# Patient Record
Sex: Female | Born: 1935 | Race: Black or African American | Hispanic: No
Health system: Southern US, Community
[De-identification: ages and names within clinical notes are randomized; demographics above are authoritative.]

---

## 2004-11-23 ENCOUNTER — Other Ambulatory Visit: Payer: Self-pay

## 2004-11-23 ENCOUNTER — Emergency Department: Payer: Self-pay | Admitting: Unknown Physician Specialty

## 2009-09-27 ENCOUNTER — Inpatient Hospital Stay: Payer: Self-pay | Admitting: Internal Medicine

## 2011-08-20 ENCOUNTER — Emergency Department: Payer: Self-pay | Admitting: Emergency Medicine

## 2011-09-20 ENCOUNTER — Emergency Department: Payer: Self-pay | Admitting: Unknown Physician Specialty

## 2011-11-08 ENCOUNTER — Ambulatory Visit: Payer: Self-pay | Admitting: Internal Medicine

## 2011-11-13 ENCOUNTER — Inpatient Hospital Stay: Payer: Self-pay | Admitting: Internal Medicine

## 2011-11-13 LAB — URINALYSIS, COMPLETE
Bilirubin,UR: NEGATIVE
Blood: NEGATIVE
Nitrite: NEGATIVE
Ph: 5 (ref 4.5–8.0)
RBC,UR: 159 /HPF (ref 0–5)
Squamous Epithelial: 8

## 2011-11-13 LAB — CK TOTAL AND CKMB (NOT AT ARMC)
CK, Total: 41 U/L (ref 21–215)
CK-MB: 0.6 ng/mL (ref 0.5–3.6)
CK-MB: 0.9 ng/mL (ref 0.5–3.6)

## 2011-11-13 LAB — COMPREHENSIVE METABOLIC PANEL
Anion Gap: 10 (ref 7–16)
Bilirubin,Total: 0.4 mg/dL (ref 0.2–1.0)
Calcium, Total: 9.7 mg/dL (ref 8.5–10.1)
Chloride: 102 mmol/L (ref 98–107)
Co2: 28 mmol/L (ref 21–32)
Creatinine: 0.88 mg/dL (ref 0.60–1.30)
EGFR (Non-African Amer.): >60
Glucose: 174 mg/dL — ABNORMAL HIGH (ref 65–99)
Osmolality: 286 (ref 275–301)
Potassium: 3.6 mmol/L (ref 3.5–5.1)
Sodium: 140 mmol/L (ref 136–145)

## 2011-11-13 LAB — TROPONIN I: Troponin-I: 0.03 ng/mL

## 2011-11-13 LAB — CBC
MCH: 29.9 pg (ref 26.0–34.0)
MCHC: 32.8 g/dL (ref 32.0–36.0)
MCV: 91 fL (ref 80–100)
Platelet: 261 10*3/uL (ref 150–440)
RBC: 3.77 10*6/uL — ABNORMAL LOW (ref 3.80–5.20)
RDW: 16.5 % — ABNORMAL HIGH (ref 11.5–14.5)

## 2011-11-13 LAB — MAGNESIUM: Magnesium: 1.7 mg/dL — ABNORMAL LOW

## 2011-11-13 LAB — PHOSPHORUS: Phosphorus: 2.3 mg/dL — ABNORMAL LOW (ref 2.5–4.9)

## 2011-11-14 LAB — CBC WITH DIFFERENTIAL/PLATELET
Basophil #: 0 10*3/uL (ref 0.0–0.1)
Eosinophil #: 0 10*3/uL (ref 0.0–0.7)
MCH: 29.9 pg (ref 26.0–34.0)
MCHC: 33.2 g/dL (ref 32.0–36.0)
MCV: 90 fL (ref 80–100)
Monocyte #: 0.6 10*3/uL (ref 0.0–0.7)
Neutrophil %: 46.5 %
Platelet: 246 10*3/uL (ref 150–440)
RBC: 3.6 10*6/uL — ABNORMAL LOW (ref 3.80–5.20)
RDW: 16.1 % — ABNORMAL HIGH (ref 11.5–14.5)

## 2011-11-14 LAB — CK TOTAL AND CKMB (NOT AT ARMC)
CK, Total: 47 U/L (ref 21–215)
CK-MB: 1.1 ng/mL (ref 0.5–3.6)

## 2011-11-14 LAB — TSH: Thyroid Stimulating Horm: 2.9 u[IU]/mL

## 2011-11-14 LAB — IRON AND TIBC
Iron Bind.Cap.(Total): 249 ug/dL — ABNORMAL LOW (ref 250–450)
Iron Saturation: 8 %
Unbound Iron-Bind.Cap.: 229 ug/dL

## 2011-11-14 LAB — BASIC METABOLIC PANEL
Anion Gap: 11 (ref 7–16)
Chloride: 104 mmol/L (ref 98–107)
Co2: 26 mmol/L (ref 21–32)
Creatinine: 0.6 mg/dL (ref 0.60–1.30)
EGFR (African American): >60
Glucose: 106 mg/dL — ABNORMAL HIGH (ref 65–99)
Sodium: 141 mmol/L (ref 136–145)

## 2011-11-14 LAB — MAGNESIUM: Magnesium: 2.4 mg/dL

## 2011-11-14 LAB — TROPONIN I: Troponin-I: 0.03 ng/mL

## 2011-11-14 LAB — PROTIME-INR: Prothrombin Time: 13.2 secs (ref 11.5–14.7)

## 2011-11-15 LAB — HEMOGLOBIN: HGB: 9.9 g/dL — ABNORMAL LOW (ref 12.0–16.0)

## 2011-11-15 LAB — BASIC METABOLIC PANEL
Calcium, Total: 8.9 mg/dL (ref 8.5–10.1)
Chloride: 107 mmol/L (ref 98–107)
Co2: 27 mmol/L (ref 21–32)
Creatinine: 0.68 mg/dL (ref 0.60–1.30)
EGFR (African American): >60
Potassium: 3.8 mmol/L (ref 3.5–5.1)
Sodium: 142 mmol/L (ref 136–145)

## 2011-11-16 LAB — URINE CULTURE

## 2011-11-18 ENCOUNTER — Inpatient Hospital Stay: Payer: Self-pay | Admitting: Internal Medicine

## 2011-11-18 LAB — CBC
HCT: 33.9 % — ABNORMAL LOW (ref 35.0–47.0)
HGB: 11.2 g/dL — ABNORMAL LOW (ref 12.0–16.0)
MCH: 30.1 pg (ref 26.0–34.0)
MCHC: 33 g/dL (ref 32.0–36.0)
Platelet: 275 10*3/uL (ref 150–440)

## 2011-11-18 LAB — COMPREHENSIVE METABOLIC PANEL
Alkaline Phosphatase: 91 U/L (ref 50–136)
Bilirubin,Total: 0.2 mg/dL (ref 0.2–1.0)
Calcium, Total: 10.1 mg/dL (ref 8.5–10.1)
Chloride: 102 mmol/L (ref 98–107)
Co2: 28 mmol/L (ref 21–32)
Creatinine: 1.04 mg/dL (ref 0.60–1.30)
EGFR (African American): >60
EGFR (Non-African Amer.): 52 — ABNORMAL LOW
Glucose: 96 mg/dL (ref 65–99)
SGPT (ALT): 18 U/L

## 2011-12-06 ENCOUNTER — Ambulatory Visit: Payer: Self-pay | Admitting: Internal Medicine

## 2012-03-23 ENCOUNTER — Ambulatory Visit: Payer: Self-pay | Admitting: Internal Medicine

## 2012-03-31 IMAGING — CR DG CHEST 1V PORT
1 series · 1 of 1 positions shown · non-contrast
Comparison: none

REASON FOR EXAM: weakness
COMMENTS:

[x chest ap]
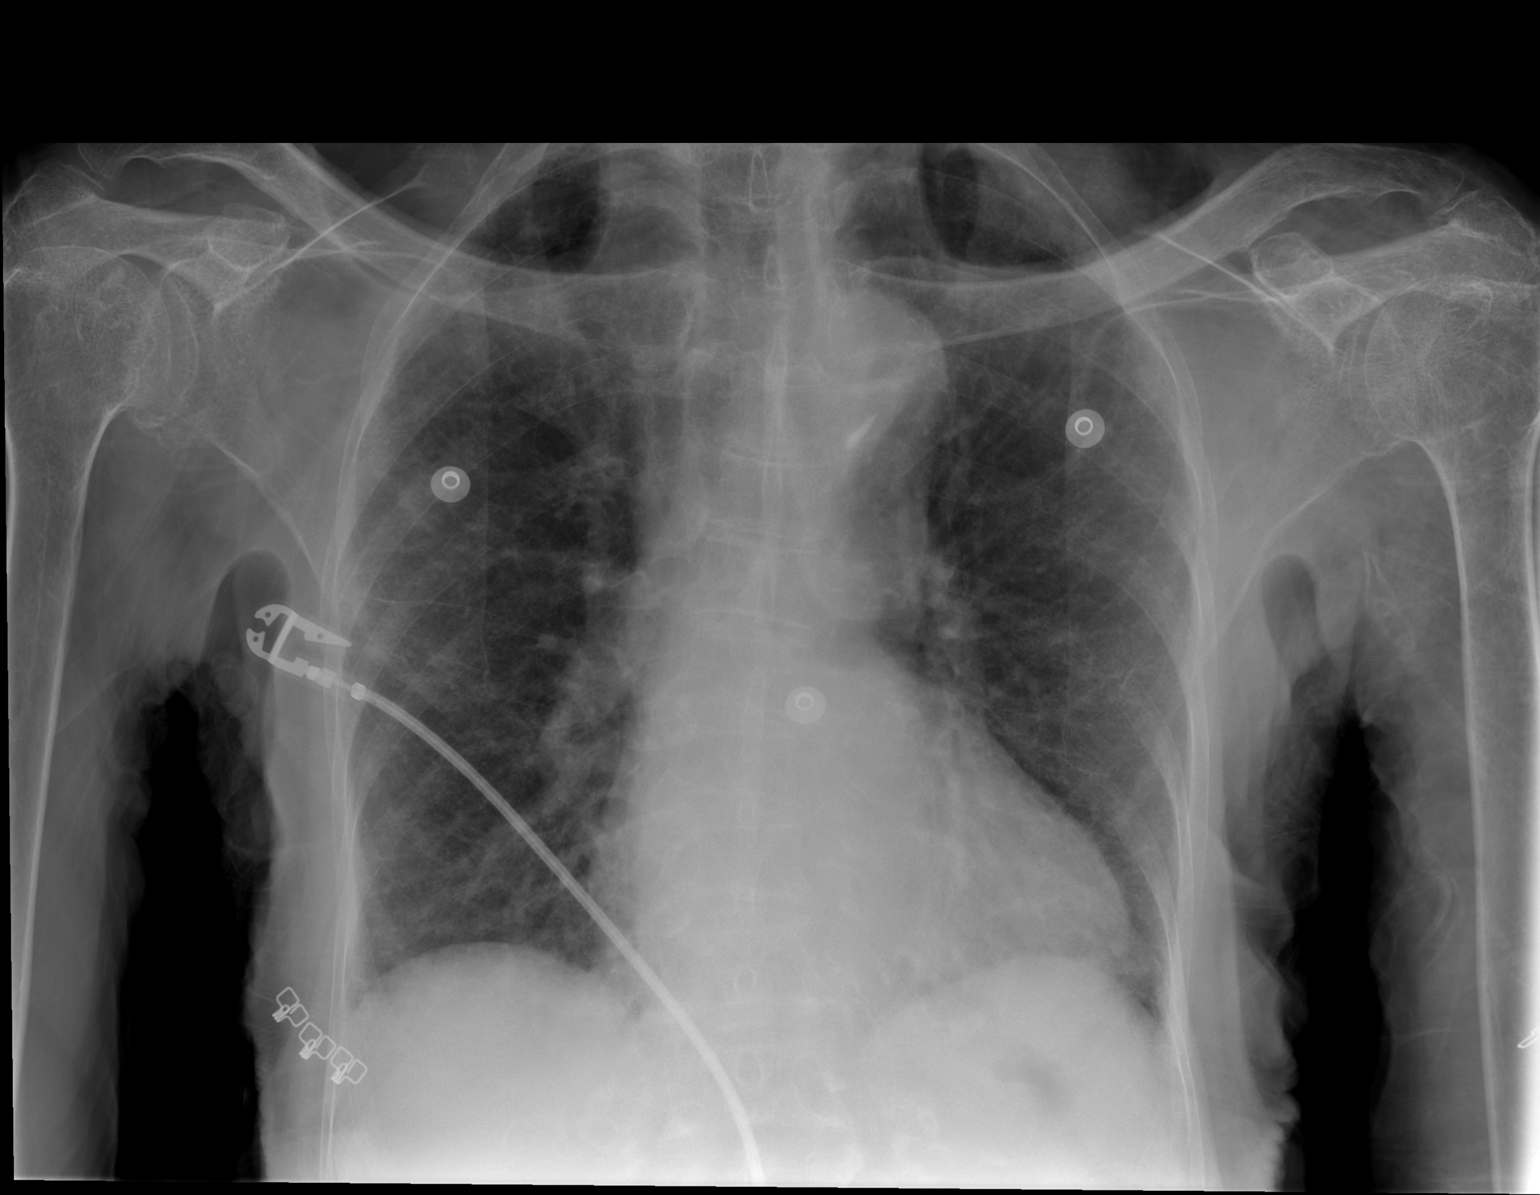

[1 of 1 positions shown; findings below may reference images not displayed]

PROCEDURE:     DXR - DXR PORTABLE CHEST SINGLE VIEW  - September 20, 2011 [DATE]

RESULT:

AP view of the chest is compared to the exam of 20 August, 2011.

There appears to be some hyperlucency in the supraclavicular region of the
soft tissues at the base of the neck which could be secondary to fatty
tissue. Subcutaneous emphysema is not definitely identified. There also
appears to be some artifact from the patient gown. The lungs appear to be
fully inflated and clear. If there is concern for occult pneumomediastinum
or pneumothorax then CT would be suggested. The heart size appears within
normal limits. Atherosclerotic calcification is present in the aortic arch.
Degenerative changes are seen in the shoulders. The bones are osteopenic.
IMPRESSION: Borderline cardiomegaly.

## 2012-03-31 IMAGING — CT CT HEAD WITHOUT CONTRAST
2 series · 16 of 30 positions shown, 20 images · non-contrast
Comparison: none

REASON FOR EXAM: ams weakness
COMMENTS:

PROCEDURE:     CT  - CT HEAD WITHOUT CONTRAST  - September 20, 2011 [DATE]
RESULT:     History: Weakness.
Comparison Study: Prior CT of 08/20/2011.

[Series 2: without · axial · non-contrast · 0.39mm/px · z∈[-136,-16]mm · 13 of 29 slices shown, 17 images]
[im 3/29  brain]
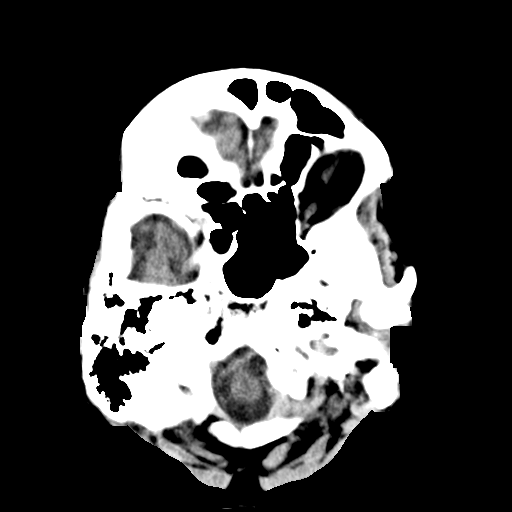
[im 3/29  bone]
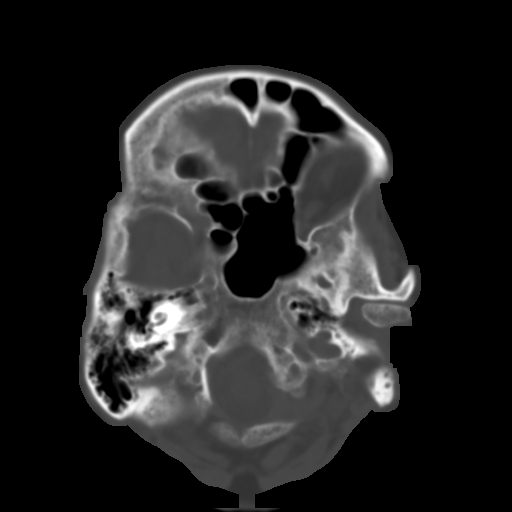
[im 5/29  brain]
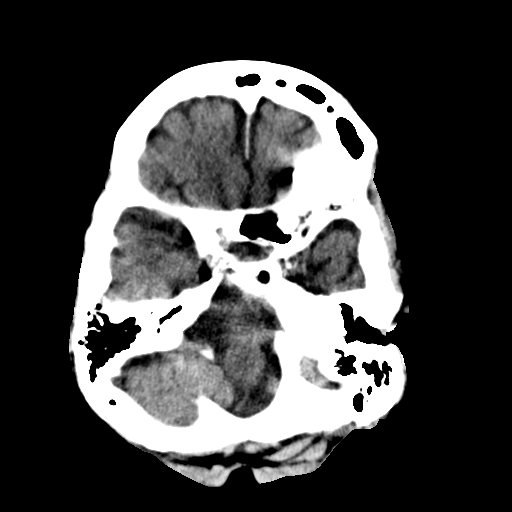
[im 7/29  brain]
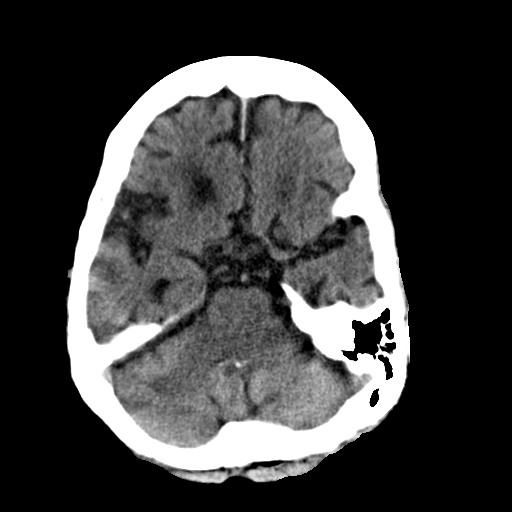
[im 9/29  brain]
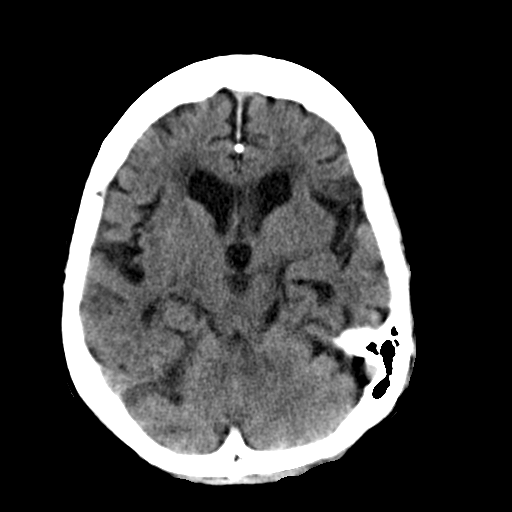
[im 11/29  brain]
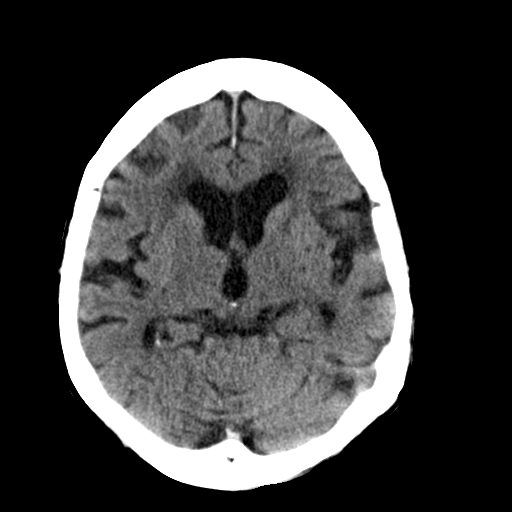
[im 11/29  bone]
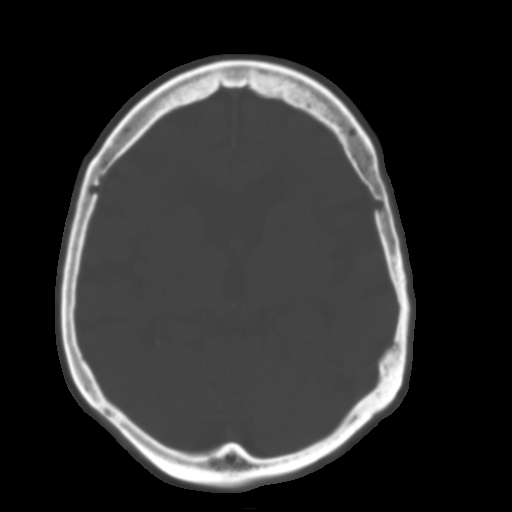
[im 13/29  brain]
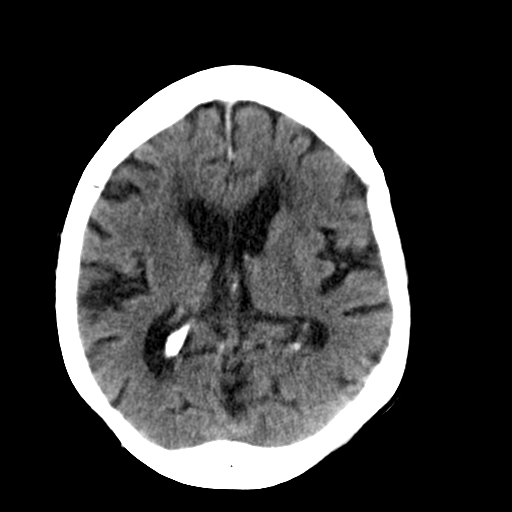
[im 15/29  brain]
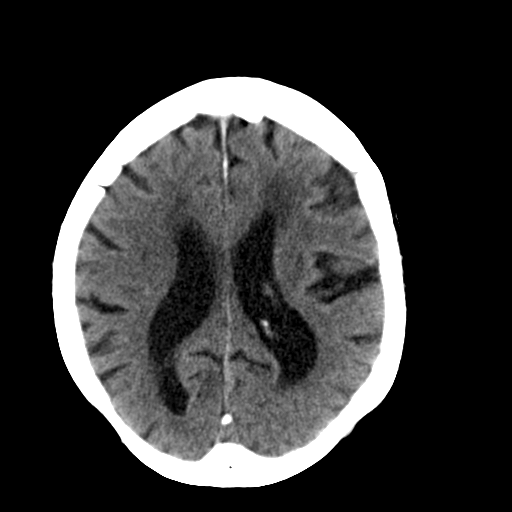
[im 17/29  brain]
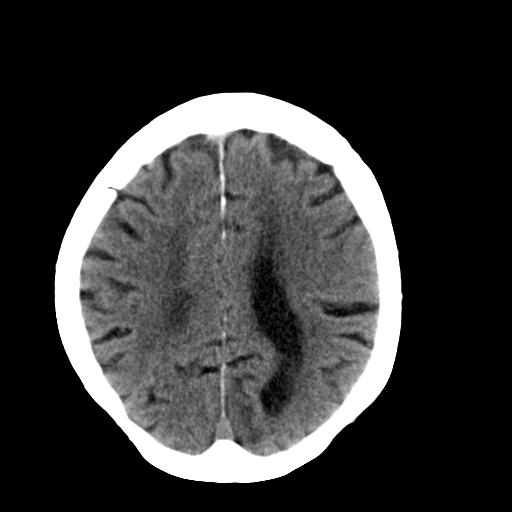
[im 19/29  brain]
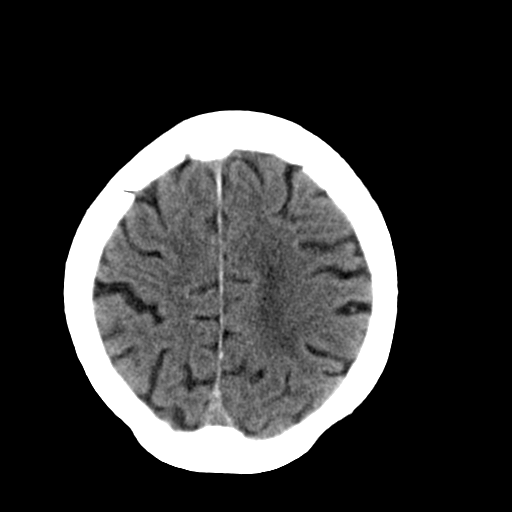
[im 19/29  bone]
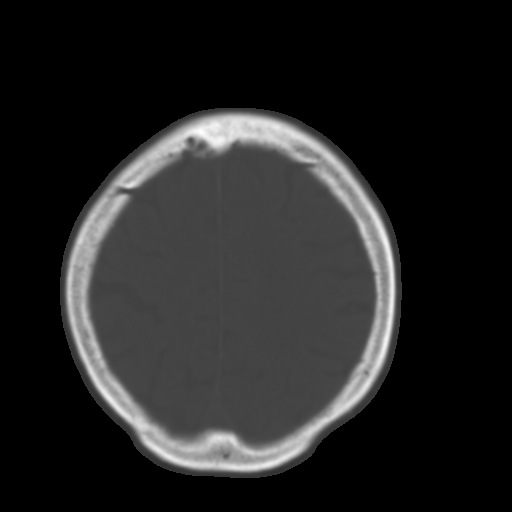
[im 21/29  brain]
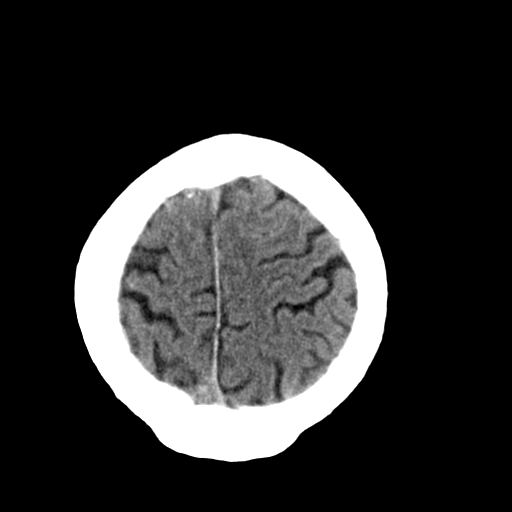
[im 23/29  brain]
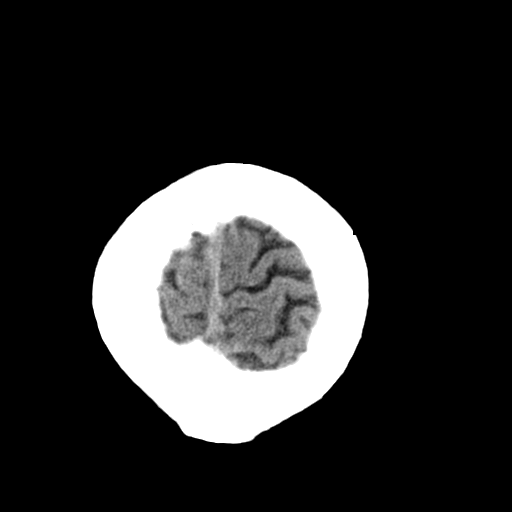
[im 25/29  brain]
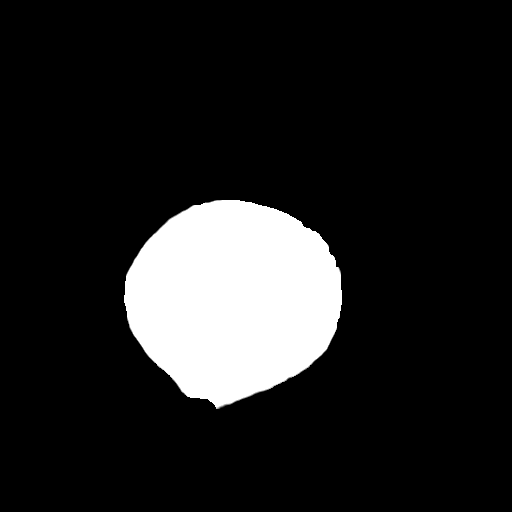
[im 27/29  brain]
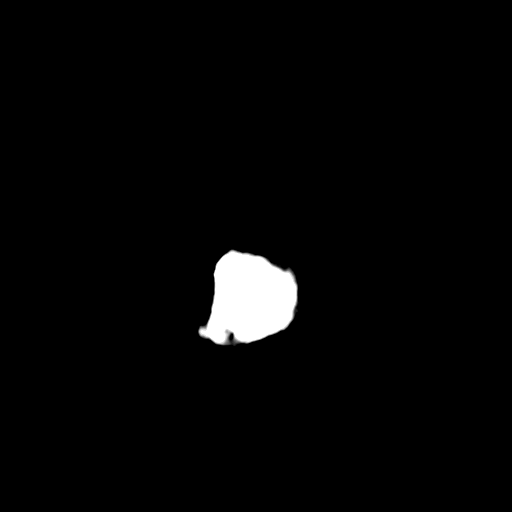
[im 27/29  bone]
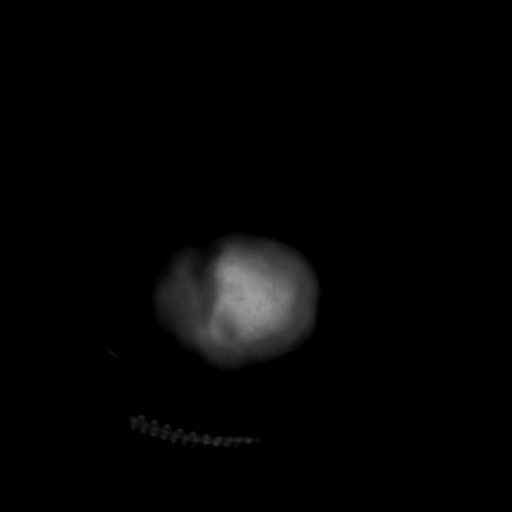

[Series 3: bone · axial · 0.39mm/px · z∈[-136,-96]mm · 3 of 29 slices shown]
[im 3/29  bone]
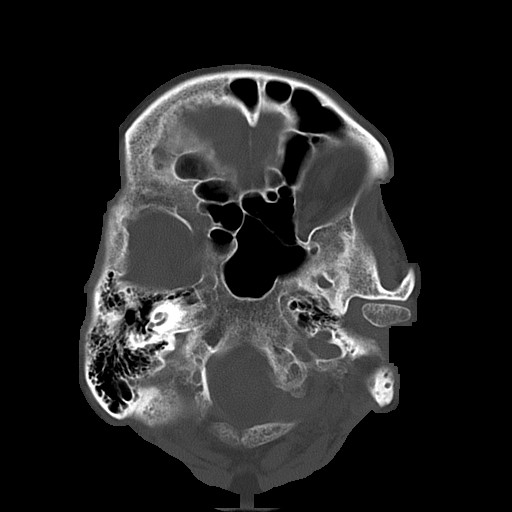
[im 7/29  bone]
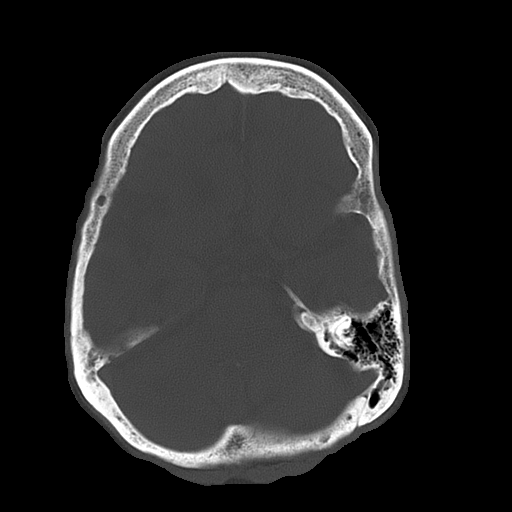
[im 11/29  bone]
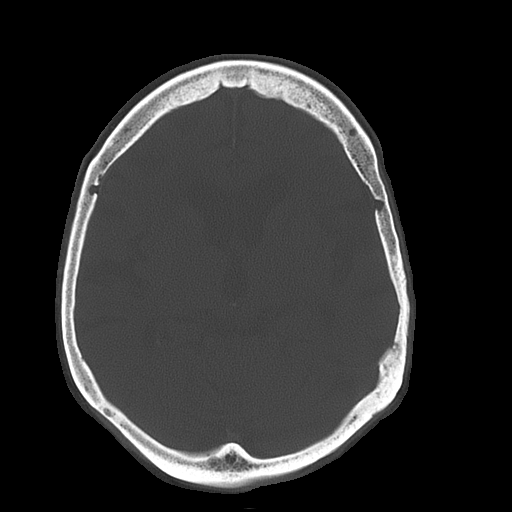

[16 of 30 positions shown; findings below may reference images not displayed]

FINDINGS: No mass. No hydrocephalus. No hemorrhage. White matter changes
consistent with chronic ischemia. No acute bony abnormality. Mucous
retention cyst noted in the sphenoid sinus. Lucency in the right thalamus.
This is consistent with lacunar infarction. This is stable.
IMPRESSION: No acute abnormality.

## 2012-12-05 DEATH — deceased

## 2015-01-29 NOTE — Consult Note (Signed)
    General Aspect 81 yo female  with history of hypertension whh presented to the the er with weakness and fatigue. She is a difficult historian. She was noted to have ventricular tachycardia in the er. She is currently on amiodarone iv and is currently in nsr. She has ruled out for an mi.   Physical Exam:   GEN thin    NECK supple    RESP clear BS    CARD Regular rate and rhythm    ABD denies tenderness  normal BS    LYMPH negative neck    EXTR negative cyanosis/clubbing, negative edema    SKIN normal to palpation    NEURO cranial nerves intact, motor/sensory function intact    PSYCH poor insight   Review of Systems:   Subjective/Chief Complaint poor historian.    General: Fatigue    Skin: No Complaints    ENT: No Complaints    Eyes: No Complaints    Neck: No Complaints    Respiratory: Short of breath    Cardiovascular: Tightness    Gastrointestinal: No Complaints    Genitourinary: No Complaints    Vascular: No Complaints    Musculoskeletal: No Complaints    Neurologic: No Complaints    Hematologic: No Complaints    Endocrine: No Complaints    Psychiatric: No Complaints    ROS limited historian    Medications/Allergies Reviewed Medications/Allergies reviewed     Anxiety:    HTN:   EKG:   EKG NSR    No Known Allergies:     Impression 81 yo female with history of hypertension and dementia who had non sustaned ventricular tachycardia. Currently in nsr on iv amiodarone. Would continue with amidoarone load. Echo to evaluate lv funciton.    Plan 1. IV amiodaron load and then po  2. Echo to evaluate lv funciotkion 3. Follow k and mg   Electronic Signatures: Dalia HeadingFath, Odeth Bry A (MD)  (Signed 06-Feb-13 20:55)  Authored: General Aspect/Present Illness, History and Physical Exam, Review of System, Past Medical History, EKG , Allergies, Impression/Plan   Last Updated: 06-Feb-13 20:55 by Dalia HeadingFath, Tip Atienza A (MD)

## 2015-01-29 NOTE — Consult Note (Signed)
PATIENT NAME:  Beverly Morris, Beverly Morris MR#:  [id number] DATE OF BIRTH:  10/12/1910  DATE OF CONSULTATION:  11/15/2011  REFERRING PHYSICIAN:  Conchita Paris, MD  CONSULTING PHYSICIAN:  Rose Phi. Kemper Durie, MD  HISTORY: Ms. Delgadillo is a 81 year old right-handed African American patient of Dr. Conchita Paris with history of hypertension, anemia, and anxiety who was admitted 11/13/2011 and is referred for evaluation of suspected seizure spells. History comes from the patient, her family members, her hospital chart, and her hospital records.   The patient was brought to the Emergency Room at noon on 11/13/2011 by EMTs summoned by family secondary to altered mental status. She had been not her usual self on awakening in the morning, quiet and less interactive. At approximately 10:30 a.m. after she had been up and about and had breakfast and had bath with help of caregiver, the patient was sitting at the kitchen table when she was seen by her caregiver to have a spell of shaking of head, torso, and arms lasting approximately 60 seconds followed by blank stare. She had a second shaking spell with fire department first responder, later a third spell witnessed by the ER physician. Per family member's report that between the shaking spells she had persistent staring without response to address.   EMTs found on monitoring the patient had cycles of ventricular tachycardia lasting 15 to 20 seconds alternating with sinus tachycardia for 2 or 3 minutes at a time. Brain CT scan showed no acute findings. She was started on treatment with Keppra for suspected seizures. She has had no further spells in the hospital. On telemetry in the hospital, she has had no ventricular tachycardia. She has been in sinus rhythm with occasional mild sinus tachycardia.   In the past, the patient has had no similar spells of shaking. Her family reports history of panic attacks with hyperventilation, benefiting from breathing into a bag in past years. They  report that she has not had anxiety or panic attack spells in the past few years. They report greater than 5 and less than 10 year history of spells of blank stare lasting for more than several minutes, initially occurring perhaps every couple of months, and increased in frequency in the past year or so. She had Emergency Room visits for spells of gaze and not speaking 08/20/2011 and 09/20/2011. She had Emergency Room visit and brain CT scan 10/01/2009 after she had a fall with history that she had the onset two or so days earlier of right leg weakness. Her scan showed evidence of right cerebellar hemisphere, small to moderate area of nonhemorrhagic stroke found to not be present on prior scans.   PHYSICAL EXAMINATION: The patient is a well developed thin elderly African American woman who was examined lying semisupine in no apparent distress with blood pressure 145/75 and heart rate 80. There was no fever. She was normocephalic without evidence of trauma and her neck was supple. She was alert and was oriented to persons and location and to time of day and month but not to date. Her speech was clear and she had normal expression and affect. She was very hard of hearing but communicated fairly well when addressed slowly at increased volume. Cranial nerve examination was otherwise normal including eye movements and full visual fields to finger count for each eye. Motor examination showed good power in the arms and legs bilaterally. Coordination examination was notable for mild dystaxia of right finger-to-nose movements. Her gait was not tested.   IMPRESSION: Spells most  consistent with new seizure disorder in an elderly individual.   RECOMMENDATIONS:  1. Treat with Keppra 500 mg twice a day after initial period of 1000 mg twice a day for three days.  2. I do not see any indication for lumbar puncture.   I appreciate being asked to see this pleasant and interesting lady.    ____________________________ Rose PhiPeter R. Kemper Durielarke, MD prc:drc D: 11/16/2011 09:34:50 ET T: 11/16/2011 10:08:03 ET JOB#: [id number]  cc: Rose PhiPeter R. Kemper Durielarke, MD, <Dictator> Gaspar GarbePETER R Quida Glasser MD ELECTRONICALLY SIGNED 11/18/2011 13:33

## 2015-01-29 NOTE — H&P (Signed)
PATIENT NAME:  Beverly Morris, Beverly Morris MR#:  [id number] DATE OF BIRTH:  October 26, 1910  DATE OF ADMISSION:  11/13/2011  PRIMARY CARE PHYSICIAN: Roe Coombs C. Candelaria Stagers, MD    REQUESTING PHYSICIAN:  Dana Allan, MD   CHIEF COMPLAINT: Seizure.  HISTORY OF PRESENT ILLNESS: The patient is a 81 year old female with a known history of anemia, hypertension, is being admitted for seizure. The patient lives at home where she was found to have possible seizure activity this morning. She is followed by a home nurse who came this morning and saw her to have twitching mainly in the upper body part and torso. EMS was called. At that time she was lucid, was not waking up. She was brought into the Emergency Department. While en route, a 12-lead EKG was performed which showed ventricular tachycardia lasting approximately 20 to 30 seconds which converted back into sinus tachycardia/sinus rhythm. While in the Emergency Department, she had another spell of twitching/seizure when the ED physician was evaluating her; and that also stopped on its own, but she was found to have ventricular tachycardia on her rhythm strip. She was asymptomatic, was awake, and was started on amiodarone drip. Subsequently, she has been comfortable. When I evaluated the patient her daughter was at the bedside, who provided most of the information as the patient is hard of hearing and somewhat confused.   PAST MEDICAL HISTORY:  1. Hypertension. 2. Anxiety.  PAST SURGICAL HISTORY:  1. ERCP with biliary sphincterotomy and balloon extraction of CBD stone in 2003.  2. Laparoscopic cholecystectomy. 3. Laparoscopic hysterectomy.   MEDICATIONS AT HOME:  1. Aspirin 81 mg p.o. daily.  2. Multivitamin once daily.  3. Calcium tablet once daily.  FAMILY HISTORY: No history of cerebrovascular accident or seizure.   SOCIAL HISTORY: No tobacco or alcohol use. She lives at home. She has a Patent examiner following.   REVIEW OF SYSTEMS: Unable to obtain as the patient  is somewhat confused, probably has underlying dementia.   PHYSICAL EXAMINATION:  VITAL SIGNS: Temperature 97.6, heart rate 102 per minute, respirations 24 per minute, blood pressure 120/71 mmHg. She is saturating 97% on room air.   GENERAL: The patient is a 81 year old female lying in the bed comfortably without any acute distress.   HEENT: Eyes: Pupils are equal, round and reactive to light and accommodation. No scleral icterus. Extraocular muscles are intact. HENT: Head atraumatic, normocephalic. Oropharynx and nasopharynx are clear.   NECK: Supple. No jugular venous distention. No thyroid enlargement or tenderness.   LUNGS: Clear to auscultation bilaterally. No wheezing, rales, rhonchi, or crepitation.   HEART: S1, S2 normal. No murmurs, rubs, or gallops.   ABDOMEN: Soft, nontender, nondistended. Bowel sounds are present. No organomegaly or mass.   EXTREMITIES: No pedal edema, cyanosis or clubbing.   SKIN: No obvious rash, lesion or ulcer. She has depigmentation all over her lower extremities.    NEUROLOGIC: she does have more weakness in the left side likely from her old cerebrovascular accident. Strength about 4 out of 5 in left upper extremity and lower extremity, otherwise 5 out of 5 strength all other extremities. She is moving all her extremities without any difficulties. Cranial nerves III through XII are intact. Sensation is intact.   PSYCHIATRIC: The patient is disoriented although could identify her daughter. She does not know where she is. She is alert.   LABORATORY, DIAGNOSTIC AND RADIOLOGICAL DATA:  Normal BMP, magnesium 1.7, phosphorus of 2.3.  Normal renal function tests.  Normal first set of cardiac  enzymes.  Normal CBC except hemoglobin of 11.3, hematocrit 34.4.  Urinalysis shows 4036 WBCs, 1+ bacteria, 2+ leukocyte esterase, WBC in clumps.  EKG shows sinus tachycardia with first-degree AV block, no major ST-T wave changes. Her rhythm strip does show ventricular  tachycardia.   IMPRESSION AND PLAN:  1. Seizure: We will start her on Keppra 500 mg b.i.d. as per discussion with Dr. Suzan SlickPeter Clarke on the phone. He recommended to increase the Keppra to 1000 mg b.i.d.  for the next two days and bring it down to 500 mg b.i.d. after two days. This could be due to underlying seizure focus (old cerebrovascular accident.) We will keep her in the Critical Care Unit for seizure and fall precautions. Hold off Neurology consult as per Dr. Suzan SlickPeter Clarke; if needed, call his office. Ventricular tachycardia: We will start her on amiodarone drip. Cardiology consult will be obtained. We will obtain 2-D echocardiogram, replace her electrolytes aggressively, including magnesium and phosphorus. We will continue aspirin.  2. Hypertension: Blood pressure is stable. We will monitor while in the Critical Care Unit.  3. History of anxiety: Not on medication. Her mood remains stable.  4.   UTI: Based on UA, will obtain urine c/s, start rocephin.  CODE STATUS: FULL CODE. I discussed with her daughter, who agrees with the same. She states she does not want a long-term artificial machine or a ventilator. We will consider Palliative Care consult if things change while in the hospital.   TOTAL TIME TAKING CARE OF THIS PATIENT (CRITICAL CARE): 55 minutes.   The patient remains at high risk of cardiorespiratory arrest and recurrent seizure. We will monitor in the Critical Care Unit.  ____________________________ Ellamae SiaVipul S. Sherryll BurgerShah, MD vss:cbb D: 11/13/2011 16:08:32 ET T: 11/13/2011 16:36:03 ET JOB#: [id number] cc: Don C. Candelaria Stagershaplin, MD Patricia PesaVIPUL S Jamon Hayhurst MD ELECTRONICALLY SIGNED 11/14/2011 11:44

## 2015-01-29 NOTE — Discharge Summary (Signed)
PATIENT NAME:  Beverly Morris, Beverly Morris MR#:  [id number] DATE OF BIRTH:  [birth year]  DATE OF ADMISSION:  11/13/2011 DATE OF DISCHARGE:  11/16/2011  DIAGNOSES AT TIME OF DISCHARGE: 1. Seizure disorder.  2. Ventricular tachycardia.  3. Hypertension.  4. History of anxiety.  5. Urinary tract infection.   CHIEF COMPLAINT: Seizures.   HISTORY OF PRESENT ILLNESS: Salvadore DomFlora Schnetzer is a 81 year old female with a known history of anemia and hypertension admitted by the ER with seizures. The patient reportedly is followed by home nurse who came in that morning and saw her twitching mainly in the upper part of the body and torso. The patient was brought to the ER. En route a 12-lead EKG showed evidence of ventricular tachycardia that lasted approximately 20 to 30 seconds which converted back to sinus rhythm. The patient also had another episode of seizures in the ED and again was noted to have V. tach on her rhythm strip.   PAST MEDICAL HISTORY:  1. Anxiety. 2. Hypertension.  3. Previous ERCP with biliary sphincterotomy and balloon extraction of CBD stone in 2003.  4. History of laparoscopic cholecystectomy. 5. Laparoscopic hysterectomy.   PHYSICAL EXAMINATION: She is afebrile, somewhat confused with underlying dementia, heart rate 102, respirations 24, blood pressure 120/71, oxygen sat 97% on room air. She was not in distress. Pupils equal and reactive to light. HEENT normocephalic, atraumatic. NECK supple. No JVD. HEART S1, S2. ABDOMEN soft, nontender. EXTREMITIES no edema. NEUROLOGIC alert and oriented. No obvious focal signs.   LABORATORY, DIAGNOSTIC, AND RADIOLOGICAL DATA: Magnesium 1.7, phosphorus 2.3. Cardiac enzymes are normal. Hemoglobin 11.3, hematocrit 34.4. Urinalysis showed 1+ bacteria, 2+ leukocyte esterase, WBCs in clumps. EKG showed sinus tach with first degree AV block and rhythm with strip did show evidence of ventricular tachycardia.   HOSPITAL COURSE: The patient was seen in consultation by both  Dr. Lady GaryFath, cardiologist, and Dr. Kemper Durielarke, neurologist. She was started on amiodarone and also Keppra. The patient appeared to tolerate both these medications well and did not have any further episodes of seizures. She was in sinus rhythm and was considered stable for discharge. The patient was discharged on the following medications.   DISCHARGE MEDICATIONS:  1. Keppra 500 mg p.o. Morris.i.d.  2. Amiodarone 200 mg p.o. Morris.i.d.  3. Bactrim DS 1 tablet p.o. Morris.i.d. for three more days for urinary tract infection.  4. She also did receive Rocephin while she was in the hospital.  FOLLOW-UP: She has been advised to follow-up with Dr. Candelaria Stagershaplin, her primary care physician, in 1 to 2 weeks' time.  ____________________________ Barbette ReichmannVishwanath Kate Larock, MD vh:drc D: 11/17/2011 12:17:38 ET T: 11/17/2011 13:45:08 ET JOB#: [id number]  cc: Barbette ReichmannVishwanath Leopoldo Mazzie, MD, <Dictator> Barbette ReichmannVISHWANATH Lyonel Morejon MD ELECTRONICALLY SIGNED 11/21/2011 12:38

## 2015-01-29 NOTE — Consult Note (Signed)
Past Medical/Surgical Hx:  Anxiety:   HTN:   Home Medications: Medication Instructions Last Modified Date/Time  aspirin 81 mg oral tablet 1 tab(s) orally once a day 06-Feb-13 18:02  calcium (as carbonate) 600 mg oral tablet 2 tab(s) orally once a day 06-Feb-13 18:02  multivitamin 1 cap(s) orally once a day 06-Feb-13 18:02   Allergies:  No Known Allergies:   Vital Signs: **Vital Signs.:   08-Feb-13 13:19   Vital Signs Type Routine   Temperature Temperature (F) 97.9   Celsius 36.6   Temperature Source oral   Pulse Pulse 75   Pulse source per Dinamap   Respirations Respirations 18   Systolic BP Systolic BP 808   Diastolic BP (mmHg) Diastolic BP (mmHg) 70   Mean BP 90   BP Source Dinamap   Pulse Ox % Pulse Ox % 96   Pulse Ox Activity Level  At rest   Oxygen Delivery Room Air/ 21 %   Lab Results:  Thyroid:  07-Feb-13 04:21    Thyroid Stimulating Hormone 2.90  Hepatic:  06-Feb-13 12:24    Bilirubin, Total 0.4   Alkaline Phosphatase 86   SGPT (ALT) 15   SGOT (AST) 28   Total Protein, Serum 7.7   Albumin, Serum   3.0  Routine Chem:  06-Feb-13 12:24    Phosphorus, Serum   2.3  07-Feb-13 04:21    Iron Binding Capacity (TIBC)   249   Unbound Iron Binding Capacity 229   Iron, Serum   20   Iron Saturation 8   Magnesium, Serum 2.4  08-Feb-13 04:56    Glucose, Serum 85   BUN 9   Creatinine (comp) 0.68   Sodium, Serum 142   Potassium, Serum 3.8   Chloride, Serum 107   CO2, Serum 27   Calcium (Total), Serum 8.9   Anion Gap 8   Osmolality (calc) 281   eGFR (African American) >60   eGFR (Non-African American) >60  Cardiac:  07-Feb-13 04:21    CK, Total 47   CPK-MB, Serum 1.1   Troponin I 0.03  Routine UA:  06-Feb-13 13:44    Color (UA) Yellow   Clarity (UA) Turbid   Glucose (UA) Negative   Bilirubin (UA) Negative   Ketones (UA) Negative   Specific Gravity (UA) 1.025   Blood (UA) Negative   pH (UA) 5.0   Nitrite (UA) Negative   Leukocyte Esterase (UA) 2+    RBC (UA) 159 /HPF   Bacteria (UA) 1+   Epithelial Cells (UA) 8 /HPF   WBC Clump (UA) PRESENT   Mucous (UA) PRESENT   Amorphous Crystal (UA) PRESENT  Routine Coag:  07-Feb-13 04:21    Prothrombin 13.2   INR 1.0  Routine Hem:  07-Feb-13 04:21    WBC (CBC) 4.7   RBC (CBC)   3.60   Hematocrit (CBC)   32.5   Platelet Count (CBC) 246   MCV 90   MCH 29.9   MCHC 33.2   RDW   16.1   Neutrophil % 46.5   Lymphocyte % 40.5   Monocyte % 11.9   Eosinophil % 0.8   Basophil % 0.3   Neutrophil # 2.2   Lymphocyte # 1.9   Monocyte # 0.6   Eosinophil # 0.0   Basophil # 0.0  08-Feb-13 04:56    Hemoglobin (CBC)   9.9   Radiology Results: CT:    06-Feb-13 13:36, CT Head Without Contrast   CT Head Without Contrast  REASON FOR EXAM:    altered mental status, hx CVA, vtach today  COMMENTS:       PROCEDURE: CT  - CT HEAD WITHOUT CONTRAST  - Nov 13 2011  1:36PM     RESULT: Axial noncontrast CT scanning was performed through the brain   with reconstructions at 5 mm intervals and slice thicknesses. Comparison   made to study of 20 September 2011.    There is mild diffuse cerebral and cerebellar atrophy with compensatory   ventriculomegaly. These findings are stable. There is mildly decreased   density in the deep white matter of both cerebral hemispheres consistent   with chronic small vessel ischemic type change. There is no evidence of   an acute intracranial hemorrhage nor of an acute evolving ischemic   infarction. There is an old lacunar infarction in theanterior aspect of     the right thalamic nucleus. The cerebellum demonstrates no acute   abnormality but previous ischemic insults in the right cerebellar   hemisphere are suspected    At bone window settings the observed portions of the paranasal sinuses   and mastoid air cells are clear with the exception of the lateral aspect   of the left sphenoid sinus where there is soft tissue density not greatly   changed from the  previous study. There is no evidence of an acute skull   fracture.    IMPRESSION:   1. I do not see evidence of an acute ischemic or hemorrhagic infarction.  2. There is no intracranial mass effect.  3. There is mild stable diffuse cerebral and cerebellar atrophy with   compensatory ventriculomegaly. Old lacunar infarctionsin the right     cerebellar hemisphere and in the right thalamic nuclei are present.  4. Soft tissue density laterally in the left sphenoid sinus likely   reflects a retention cyst or polyp.          Verified By: DAVID A. Martinique, M.D., MD   Impression/Recommendations:  Recommendations:   Please see my dictation for details. [id number] episode suggestive of seizure in setting of V.tach. NO history of seizure. Family prefers not go for any additional medication of EEG at her age.can d/c keppra for now.In future if she continues to have seizure like spells can reconsider doing work up and starting Anti Epileptic Drug.  for the opportunity to participate in care of your patient. Feel free to contact me with any further questions on this pt, I will follow this patient on PRN basis.  Loletta Specter had talked to admitting physician and then Dr. Mable Fill but I saw the pt as I was on call.  Electronic Signatures: Ray Church (MD)  (Signed [phone] 23:03)  Authored: PAST MEDICAL/SURGICAL HISTORY, HOME MEDICATIONS, ALLERGIES, NURSING VITAL SIGNS, LAB RESULTS, RADIOLOGY RESULTS, Recommendations   Last Updated: 08-Feb-13 23:03 by Ray Church (MD)

## 2015-01-29 NOTE — Consult Note (Signed)
PATIENT NAME:  Beverly Morris, Beverly Morris MR#:  [id number] DATE OF BIRTH:  02/22/1911  DATE OF CONSULTATION:  11/15/2011  REFERRING PHYSICIAN:  Jimmie Mollyon C. Candelaria Stagershaplin, MD   CONSULTING PHYSICIAN:  Rahsaan Weakland K. Sherryll BurgerShah, MD  REASON FOR CONSULTATION: Possible seizure. The patient was initially consulted to Dr. Kemper Durielarke, who asked me to see this patient.   HISTORY OF PRESENT ILLNESS: Beverly Morris is a 81 year old African-American female who has a history of multiple medical problems but relatively good health. She was at home. The caretaker found her to have a twitching episode in her upper torso. She had lost consciousness for a short period time, but then she regained consciousness. By the time EMS arrived, she was back to her baseline but on monitor was found to have nonsustained ventricular tachycardia on and off lasting for 20 to 30 seconds with a skipped period of 20 to 30 minutes. On arrival to the ER, the ER physician also noted one episode of brief shaking, and he concerned that it was seizure and she was started on AED.  Her CT scan of the head was unremarkable. The patient does have baseline dementia and a previous history of stroke.   PAST MEDICAL HISTORY:  1. Hypertension.  2. Anxiety.   PAST SURGICAL HISTORY:  1. ERCP with biliary sphincterectomy and balloon extraction of CBD stone in 2003. 2. Laparoscopic cholecystectomy. 3. Laparoscopic hysterectomy.   MEDICATIONS: I reviewed her home medication list.   FAMILY HISTORY: Significant that there is no known epilepsy or stroke in the family.   SOCIAL HISTORY: Significant that she has no tobacco or drug use or alcohol use. She lives at home and has a Patent examinerHome Health nurse following. She has good social support.   REVIEW OF SYSTEMS: Her review of systems is unobtainable due to her dementia.     PHYSICAL EXAMINATION:  VITAL SIGNS: Temperature was 97.9, pulse 75, respiratory rate 18, blood pressure 130/70, pulse oximetry 96% on room air.   GENERAL: She is an  ill-looking African American female lying in bed surrounded by multiple family members.   NEUROLOGICAL:  Her eyes were open, and she was alert. She was not oriented to the time and place, but she thought that this was a hospital. She was able to recognize her son but could not tell me his date of birth. (Her son said that she might not even know that baseline. She had nine kids, and she did not even remember those things back then.) The patient was able to tell me what she ate for breakfast this morning.   She was able to follow two-step commands such as showing me two fingers in her right hand. There was difficulty with communication due to her profound hearing loss.   On her cranial nerve exam, her pupils were equal, round, and reactive. Her extraocular movements were difficult to check for as she did not follow my command of tracking me, but she tracked my face in the room. It was difficult to check for her visual field. Her face seemed symmetric. She has a profound hearing loss bilaterally.   She was able to stick her tongue out straight.   She moved all her extremities symmetrically. She had paratonia. Her sensations seemed to be intact to light touch.   I did not check her gait.   HEART: S1 and S2 heart sounds.   LUNGS: Clear to auscultation.   NECK: Carotid exam did not reveal any bruit.   DIAGNOSTIC AND RADIOLOGICAL DATA: On her  CT scan of the brain, she has some white matter microvascular ischemic changes as well as old stroke into her right cerebellum and right anterior frontal region.    ASSESSMENT AND PLAN:  1. Possible seizure-like activity which was very brief without any sure generalized tonic-clonic activity or loss of sensation or bladder, etc. As this was the first known episode, I will hold off on antiepileptic medication as well as the family is not interested in pursuing any long-term antiepileptic medications. They mentioned that they prefer not to go for MRI or any  other type of epilepsy work-up which is suggested at her age of 82. They specifically said no to pursuing EEG.  2. Previous stroke: In terms of her previous stroke involving the right cerebellar and right anterior frontal region, she will continue to take aspirin.  3. Dementia: She does have a Home Health nurse coming as well as she has a Comptroller, and she has very good social support. Her hearing loss is contributing to her cognitive impairment and the difficulty of evaluation as well. The family is not interested in any medications for her dementia, just the family.   I will follow this patient with you on a less frequent basis. Feel free to contact me with any further questions.   ____________________________ Durene Cal. Sherryll Burger, MD [id number]:cbb D: 11/15/2011 23:02:58 ET T: 11/16/2011 09:27:35 ET JOB#: [id number]  cc: Manas Hickling K. Sherryll Burger, MD, <Dictator> Durene Cal [facility] MD ELECTRONICALLY SIGNED 11/20/2011 12:30

## 2015-01-29 NOTE — H&P (Signed)
PATIENT NAME:  Beverly Morris, Beverly Morris MR#:  [id number] DATE OF BIRTH:  May 10, 1911  DATE OF ADMISSION:  11/18/2011  PRIMARY CARE PHYSICIAN: Roe Coombson C. Candelaria Stagershaplin, MD  CHIEF COMPLAINT: Unresponsiveness.   HISTORY OF PRESENT ILLNESS: This is a 81 year old female who was discharged about a week ago after having seizures and also having ventricular tachycardia. She has been doing well since being at home on Keppra and amiodarone. According to her son, today they just sat her up in the chair, and she just stared into space and was unresponsive. They brought her to the hospital, and she was found in the same condition. CT scan of her head does not show anything new. She did open her eyes for me, and he said that is the most she has done all day. We are going to go ahead and observe her overnight.   PAST MEDICAL HISTORY:  1. Hypertension.  2. Seizure disorder.  3. History of ventricular tachycardia.  4. Anxiety.   PAST SURGICAL HISTORY:  1. ERCP with sphincterotomy.  2. Cholecystectomy.  3. Hysterectomy.   ALLERGIES: No known drug allergies.   CURRENT MEDICATIONS:  1. Keppra 500 mg Morris.i.d.  2. Amiodarone 200 mg Morris.i.d.   SOCIAL HISTORY: She lives at home with her family.   FAMILY HISTORY: No history of seizure or CVAs.   REVIEW OF SYSTEMS: I am unable to obtain from the patient because of her unresponsiveness.   PHYSICAL EXAMINATION:  VITAL SIGNS: Temperature 97.6, pulse 102, respirations 82, blood pressure 128/72.   GENERAL: This is a poorly nourished white female in no acute distress.   HEENT: The pupils are reactive but sluggish. Oral mucosa is moist.  Oropharynx is clear. Nasopharynx is clear.   NECK: Supple. No JVD, lymphadenopathy or thyromegaly.   CARDIOVASCULAR: Regular rate and rhythm. There is a 2/6 systolic murmur.   LUNGS: Clear to auscultation. No dullness to percussion. She is not using accessory muscles.   ABDOMEN: Soft, nontender, nondistended. Bowel sounds are positive. No  hepatosplenomegaly. No masses.   EXTREMITIES: There is no edema.   NEUROLOGIC: She will open her eyes. She says a few words but will not cooperate for a complete neurologic exam.   SKIN: Moist with poor turgor.   LABORATORY DATA: BUN is 10, creatinine 1.04, magnesium 1.7.   ASSESSMENT AND PLAN:  1. Altered mental status: This could be a subclinical seizure that she had, or it could be a transient ischemic attack. CT scan of the brain is not showing anything. At this point, I discussed with the family and they wish no further work-up. She is on medications for seizure and for arrhythmias. We will keep her on those. She is starting to wake up some and may be getting back to baseline, so we will just observe her overnight.  2. Hypomagnesemia: It is barely below normal, but with her history of ventricular tachycardia I will go ahead and start her on some oral supplement.  3. Seizure disorder: We will continue her Keppra. We will do frequent neuro checks since she had the episode of unresponsiveness for several hours today.  4. Hypertension:  This is controlled without medications at this point.  5. History of ventricular tachycardia: She is on amiodarone. Currently her heart rate resting is in the 60s. She was started on 200 twice a day. At this point, I will go ahead and cut it back to 200 once a day at this age, since her heart rate is now down in the 60s.  TIME SPENT ON ADMISSION:  45 minutes.  ____________________________ Gracelyn Nurse, MD [id number] D: 11/18/2011 17:53:43 ET T: 11/18/2011 19:13:30 ET JOB#: [id number]  cc: Gracelyn Nurse, MD, <Dictator> Jimmie Molly. Candelaria Stagers, MD Gracelyn Nurse MD ELECTRONICALLY SIGNED 11/20/2011 12:59
